# Patient Record
Sex: Female | Born: 1990 | Race: Black or African American | Hispanic: No | Marital: Single | Smoking: Never smoker
Health system: Southern US, Community
[De-identification: ages and names within clinical notes are randomized; demographics above are authoritative.]

## PROBLEM LIST (undated history)

## (undated) DIAGNOSIS — M549 Dorsalgia, unspecified: Secondary | ICD-10-CM

## (undated) HISTORY — DX: Dorsalgia, unspecified: M54.9

---

## 2020-12-08 ENCOUNTER — Ambulatory Visit: Payer: Self-pay | Attending: Family

## 2020-12-08 DIAGNOSIS — Z23 Encounter for immunization: Secondary | ICD-10-CM

## 2021-04-03 NOTE — Progress Notes (Signed)
   Covid-19 Vaccination Clinic  Name:  Robin [name]    MRN: [id number] DOB: [birth year]  04/03/2021  Ms. Robin [name] was observed post Covid-19 immunization for 15 minutes without incident. She was provided with Vaccine Information Sheet and instruction to access the V-Safe system.   Ms. Robin [name] was instructed to call 911 with any severe reactions post vaccine: Marland Kitchen Difficulty breathing  . Swelling of face and throat  . A fast heartbeat  . A bad rash all over body  . Dizziness and weakness   Immunizations Administered    Name Date Dose VIS Date Route   Pfizer COVID-19 Vaccine 12/08/2020 11:30 AM 0.3 mL 09/07/2020 Intramuscular   Manufacturer: [employer], [employer]   Lot: [id number]   NDC: [address]-1

## 2021-10-27 DIAGNOSIS — M25561 Pain in right knee: Secondary | ICD-10-CM | POA: Diagnosis not present

## 2022-02-13 DIAGNOSIS — M25561 Pain in right knee: Secondary | ICD-10-CM | POA: Diagnosis not present

## 2022-04-17 DIAGNOSIS — Z712 Person consulting for explanation of examination or test findings: Secondary | ICD-10-CM | POA: Diagnosis not present

## 2022-04-23 ENCOUNTER — Other Ambulatory Visit: Payer: Self-pay | Admitting: Obstetrics

## 2022-04-23 DIAGNOSIS — R102 Pelvic and perineal pain: Secondary | ICD-10-CM

## 2022-04-26 ENCOUNTER — Ambulatory Visit
Admission: RE | Admit: 2022-04-26 | Discharge: 2022-04-26 | Disposition: A | Discharge status: Home / Self Care | Payer: BC Managed Care – PPO | Source: Ambulatory Visit | Attending: Obstetrics | Admitting: Obstetrics

## 2022-04-26 DIAGNOSIS — D251 Intramural leiomyoma of uterus: Secondary | ICD-10-CM | POA: Diagnosis not present

## 2022-04-26 DIAGNOSIS — R102 Pelvic and perineal pain: Secondary | ICD-10-CM

## 2022-05-11 DIAGNOSIS — D259 Leiomyoma of uterus, unspecified: Secondary | ICD-10-CM | POA: Diagnosis not present

## 2022-05-11 DIAGNOSIS — E669 Obesity, unspecified: Secondary | ICD-10-CM | POA: Diagnosis not present

## 2022-05-11 DIAGNOSIS — N946 Dysmenorrhea, unspecified: Secondary | ICD-10-CM | POA: Diagnosis not present

## 2022-06-01 DIAGNOSIS — S79922A Unspecified injury of left thigh, initial encounter: Secondary | ICD-10-CM | POA: Diagnosis not present

## 2022-06-01 DIAGNOSIS — Q113 Macrophthalmos: Secondary | ICD-10-CM | POA: Diagnosis not present

## 2022-06-01 DIAGNOSIS — J392 Other diseases of pharynx: Secondary | ICD-10-CM | POA: Diagnosis not present

## 2022-07-12 DIAGNOSIS — F439 Reaction to severe stress, unspecified: Secondary | ICD-10-CM | POA: Diagnosis not present

## 2022-08-09 DIAGNOSIS — M542 Cervicalgia: Secondary | ICD-10-CM | POA: Diagnosis not present

## 2022-08-24 DIAGNOSIS — F439 Reaction to severe stress, unspecified: Secondary | ICD-10-CM | POA: Diagnosis not present

## 2022-08-28 DIAGNOSIS — M25561 Pain in right knee: Secondary | ICD-10-CM | POA: Diagnosis not present

## 2022-09-04 DIAGNOSIS — Z131 Encounter for screening for diabetes mellitus: Secondary | ICD-10-CM | POA: Diagnosis not present

## 2022-09-04 DIAGNOSIS — Z0000 Encounter for general adult medical examination without abnormal findings: Secondary | ICD-10-CM | POA: Diagnosis not present

## 2022-09-13 DIAGNOSIS — Z113 Encounter for screening for infections with a predominantly sexual mode of transmission: Secondary | ICD-10-CM | POA: Diagnosis not present

## 2022-09-27 DIAGNOSIS — H5203 Hypermetropia, bilateral: Secondary | ICD-10-CM | POA: Diagnosis not present

## 2022-09-27 DIAGNOSIS — H52223 Regular astigmatism, bilateral: Secondary | ICD-10-CM | POA: Diagnosis not present

## 2022-09-28 DIAGNOSIS — F439 Reaction to severe stress, unspecified: Secondary | ICD-10-CM | POA: Diagnosis not present

## 2022-11-21 DIAGNOSIS — Z23 Encounter for immunization: Secondary | ICD-10-CM | POA: Diagnosis not present

## 2023-01-11 DIAGNOSIS — F439 Reaction to severe stress, unspecified: Secondary | ICD-10-CM | POA: Diagnosis not present

## 2023-02-18 DIAGNOSIS — R051 Acute cough: Secondary | ICD-10-CM | POA: Diagnosis not present

## 2023-03-06 DIAGNOSIS — R051 Acute cough: Secondary | ICD-10-CM | POA: Diagnosis not present

## 2023-04-01 DIAGNOSIS — M5432 Sciatica, left side: Secondary | ICD-10-CM | POA: Diagnosis not present

## 2023-04-01 DIAGNOSIS — M545 Low back pain, unspecified: Secondary | ICD-10-CM | POA: Diagnosis not present

## 2023-04-03 DIAGNOSIS — F439 Reaction to severe stress, unspecified: Secondary | ICD-10-CM | POA: Diagnosis not present

## 2023-04-27 IMAGING — US US PELVIS COMPLETE WITH TRANSVAGINAL
1 series · 13 of 25 positions shown · non-contrast
Comparison: None

CLINICAL DATA: Pelvic pain for 2 months, LMP 04/16/2022



[Series 1: us pelvis complete with transvaginal · 0.26mm/px · 68 acquisitions, 13 frames shown]
[im 1/68]
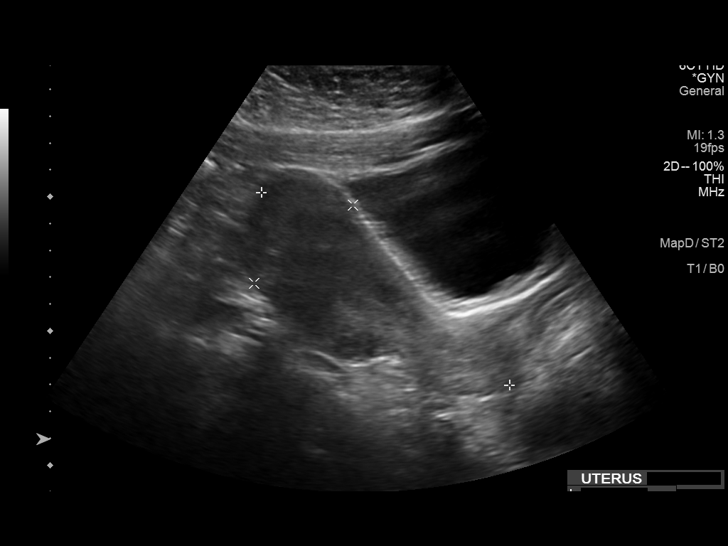
[im 6/68]
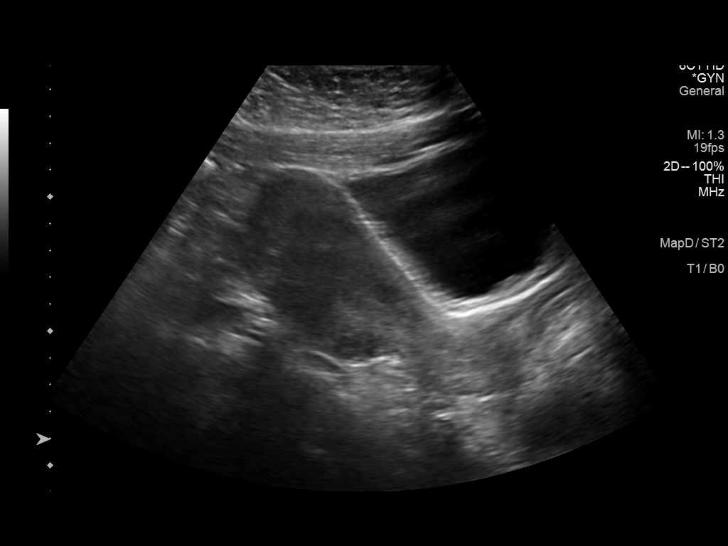
[im 12/68]
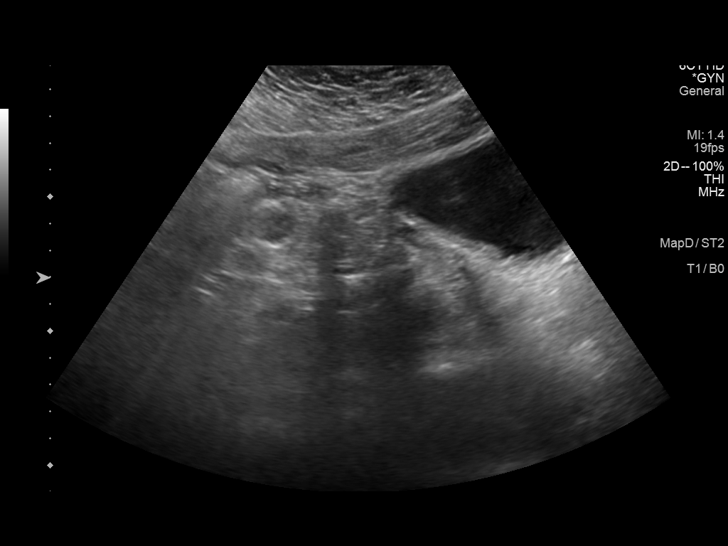
[im 17/68]
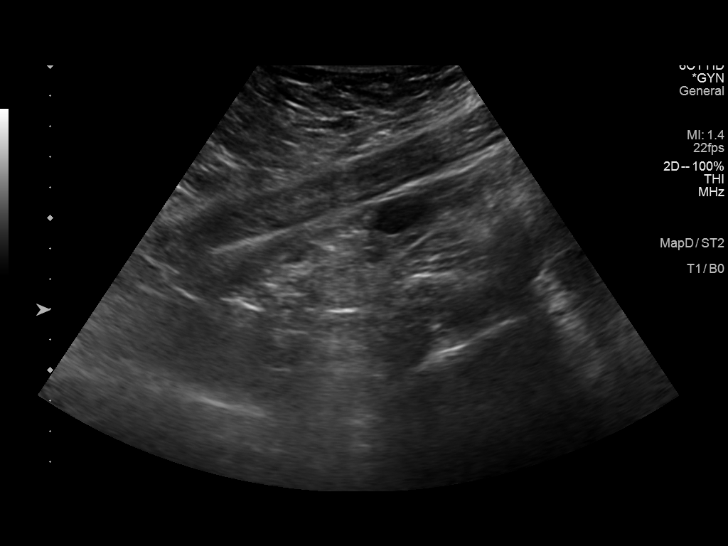
[im 23/68]
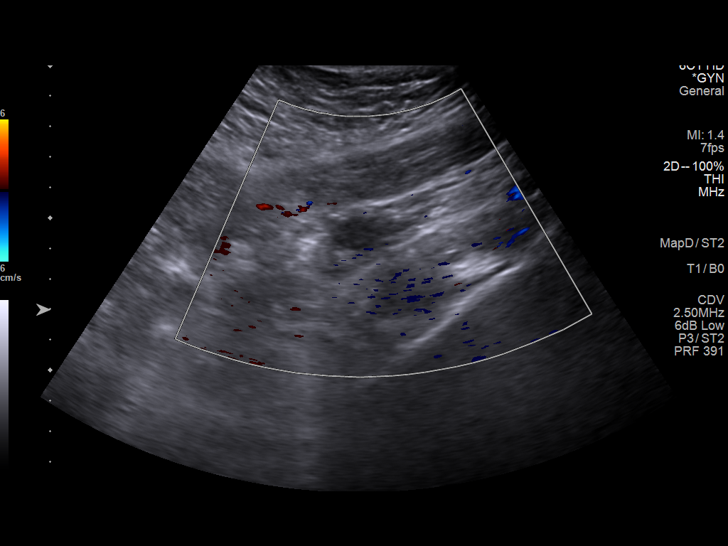
[im 28/68]
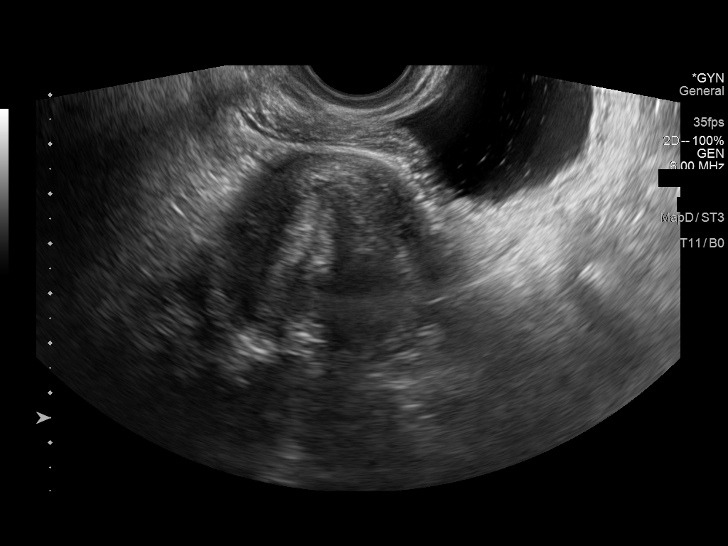
[im 34/68]
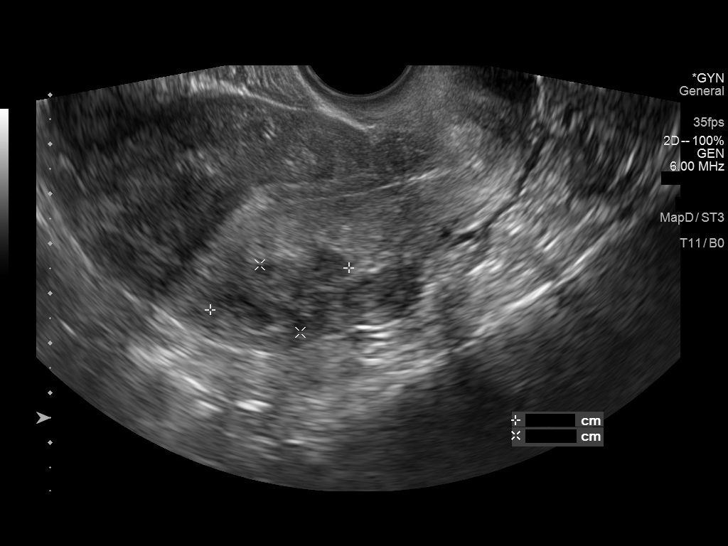
[im 40/68]
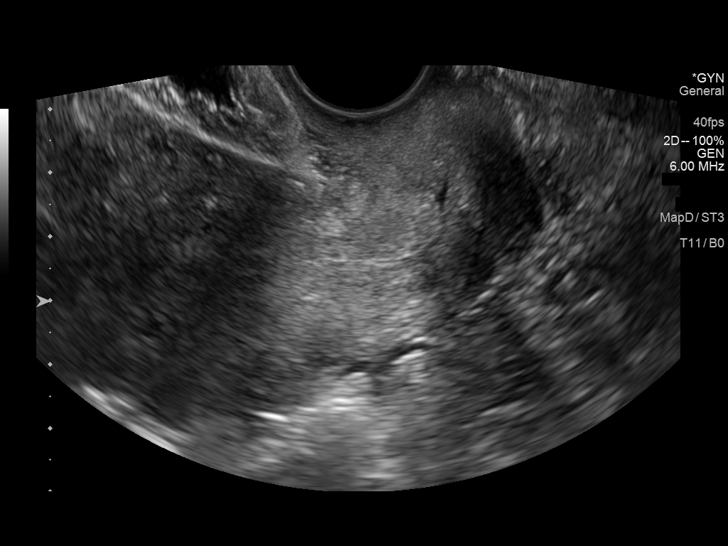
[im 45/68]
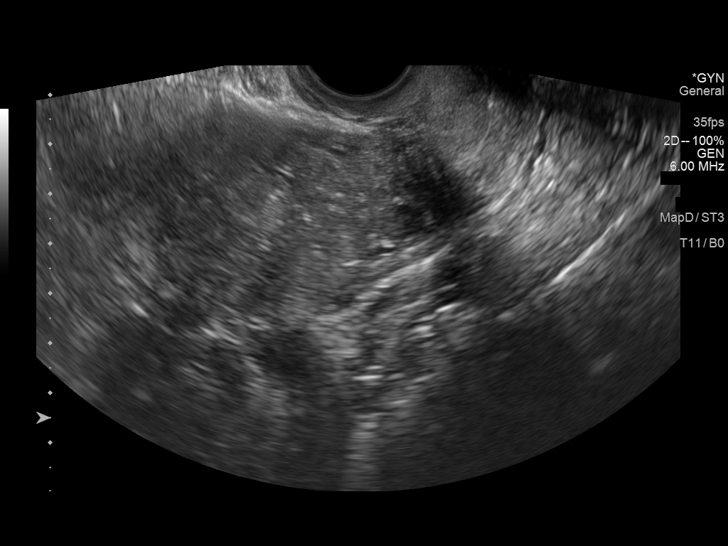
[im 51/68]
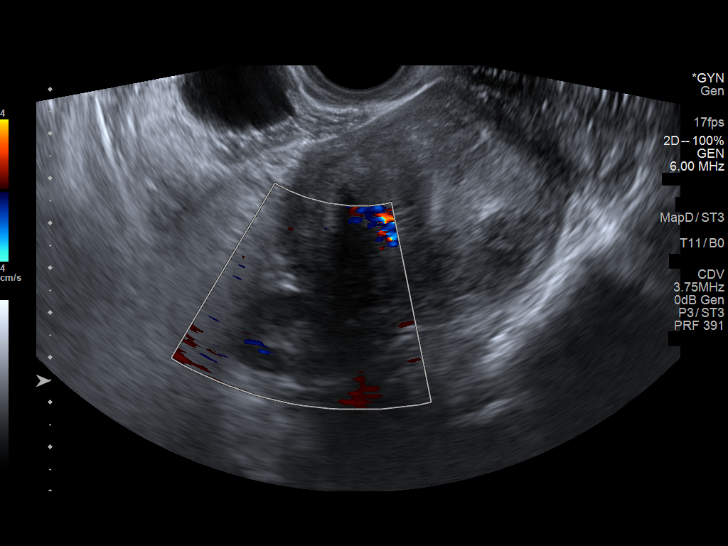
[im 56/68]
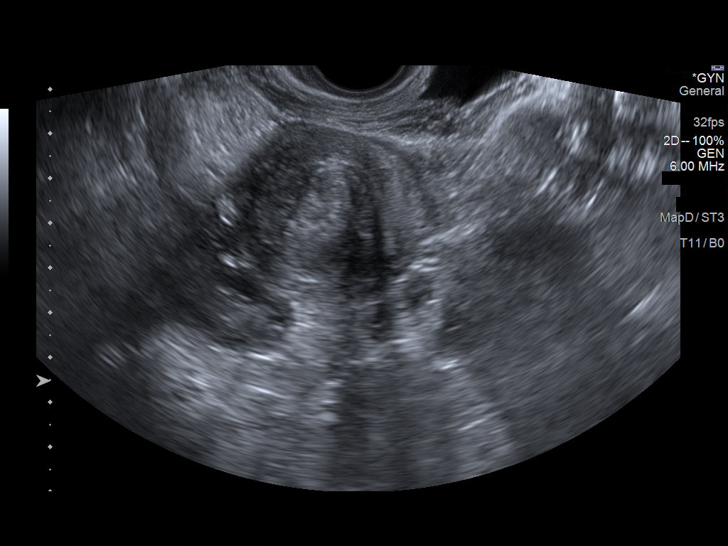
[im 62/68]
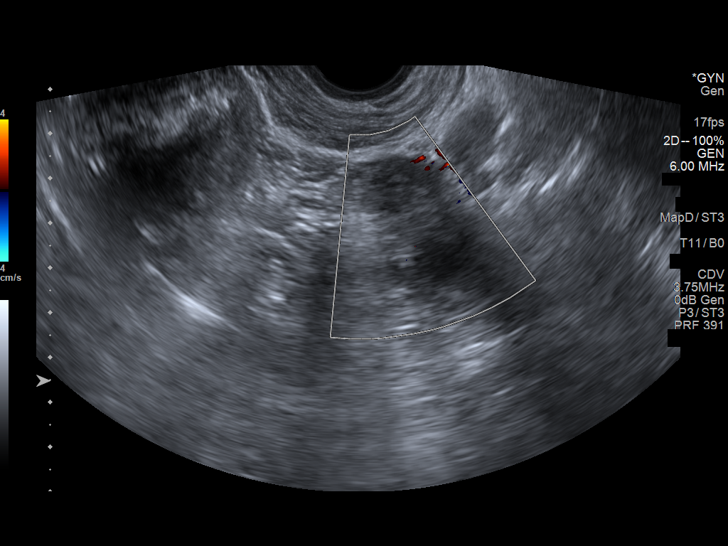
[im 68/68]
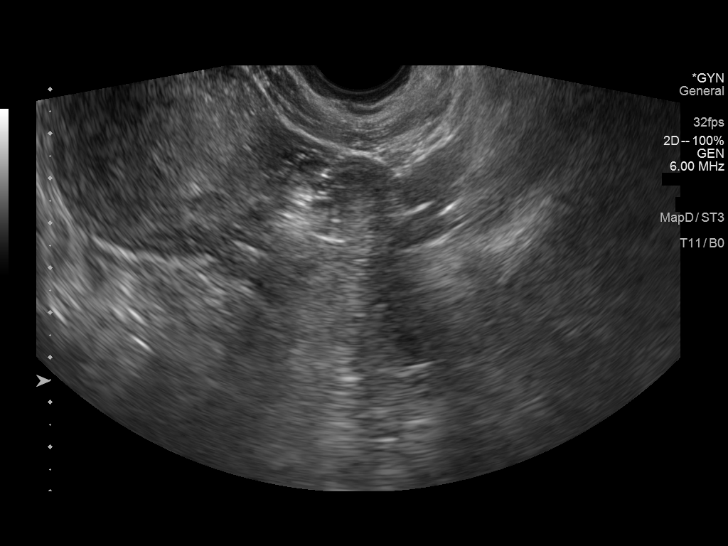

[13 of 25 positions shown; findings below may reference images not displayed]

FINDINGS: Uterus

Measurements: 11.7 x 4.7 x 5.0 cm = volume: 144 mL. Anteverted.
Heterogeneous myometrium. Several masses are seen within the
myometrium consistent with leiomyomata. These include a 3.8 cm
anterior wall submucosal leiomyoma at upper uterine segment,
exophytic posterior wall leiomyoma 1.7 cm diameter, and and
intramural 2.9 cm diameter posterior upper uterine leiomyoma.

Endometrium

Thickness: 10 mm.  No endometrial fluid or mass

Right ovary

Measurements: 3.5 x 2.0 x 2.2 cm = volume: 8.2 mL. Normal morphology
without mass

Left ovary

Measurements: 4.2 x 2.2 x 2.1 cm = volume: 10 mL. Normal morphology
without mass

Other findings

No free pelvic fluid or adnexal masses.
IMPRESSION: Three small uterine leiomyomata as above.

Remainder of exam unremarkable.

## 2023-06-21 DIAGNOSIS — S80861A Insect bite (nonvenomous), right lower leg, initial encounter: Secondary | ICD-10-CM | POA: Diagnosis not present

## 2023-06-21 DIAGNOSIS — S80862A Insect bite (nonvenomous), left lower leg, initial encounter: Secondary | ICD-10-CM | POA: Diagnosis not present

## 2023-07-03 DIAGNOSIS — F439 Reaction to severe stress, unspecified: Secondary | ICD-10-CM | POA: Diagnosis not present

## 2023-07-09 DIAGNOSIS — Z114 Encounter for screening for human immunodeficiency virus [HIV]: Secondary | ICD-10-CM | POA: Diagnosis not present

## 2023-07-09 DIAGNOSIS — R7303 Prediabetes: Secondary | ICD-10-CM | POA: Diagnosis not present

## 2023-07-09 DIAGNOSIS — Z131 Encounter for screening for diabetes mellitus: Secondary | ICD-10-CM | POA: Diagnosis not present

## 2023-07-09 DIAGNOSIS — Z0001 Encounter for general adult medical examination with abnormal findings: Secondary | ICD-10-CM | POA: Diagnosis not present

## 2023-07-09 DIAGNOSIS — Z113 Encounter for screening for infections with a predominantly sexual mode of transmission: Secondary | ICD-10-CM | POA: Diagnosis not present

## 2023-07-09 DIAGNOSIS — Z1329 Encounter for screening for other suspected endocrine disorder: Secondary | ICD-10-CM | POA: Diagnosis not present

## 2023-08-14 DIAGNOSIS — H5213 Myopia, bilateral: Secondary | ICD-10-CM | POA: Diagnosis not present

## 2023-08-14 DIAGNOSIS — H52223 Regular astigmatism, bilateral: Secondary | ICD-10-CM | POA: Diagnosis not present

## 2023-08-14 DIAGNOSIS — H5203 Hypermetropia, bilateral: Secondary | ICD-10-CM | POA: Diagnosis not present

## 2023-09-24 ENCOUNTER — Encounter: Payer: Self-pay | Admitting: Bariatrics

## 2023-09-24 ENCOUNTER — Ambulatory Visit: Payer: BC Managed Care – PPO | Admitting: Bariatrics

## 2023-09-24 VITALS — BP 132/88 | HR 97 | Temp 97.5°F | Ht 64.0 in | Wt 227.0 lb

## 2023-09-24 DIAGNOSIS — Z833 Family history of diabetes mellitus: Secondary | ICD-10-CM | POA: Diagnosis not present

## 2023-09-24 DIAGNOSIS — E66812 Obesity, class 2: Secondary | ICD-10-CM | POA: Diagnosis not present

## 2023-09-24 DIAGNOSIS — Z6838 Body mass index (BMI) 38.0-38.9, adult: Secondary | ICD-10-CM | POA: Diagnosis not present

## 2023-09-24 DIAGNOSIS — E6609 Other obesity due to excess calories: Secondary | ICD-10-CM

## 2023-09-24 DIAGNOSIS — R7309 Other abnormal glucose: Secondary | ICD-10-CM | POA: Diagnosis not present

## 2023-09-24 NOTE — Progress Notes (Signed)
Office: [phone]  /  Fax: [phone]   Initial Visit  Robin [name] was seen in clinic today to evaluate for obesity. She is interested in losing weight to improve overall health and reduce the risk of weight related complications. She presents today to review program treatment options, initial physical assessment, and evaluation.     She was [facility] by: [facility]  When asked what else they would like to accomplish? She states: Adopt healthier eating patterns, Improve quality of life, and Other: Be less sedentary   When asked how has your weight affected you? She states: Other: elevated glucose  Some associated conditions: Other: iron deficiency   Contributing factors: Family history  Weight promoting medications identified: None  Current nutrition plan: Low-carb and Other: On-line program, and some plant based, increase protein  Current level of physical activity: Walking and Step counting  Current or previous pharmacotherapy: None  Response to medication: Never tried medications   Past medical history includes:  History reviewed. No pertinent past medical history.   Objective:   BP 132/88   Pulse 97   Temp (!) 97.5 F (36.4 C)   Ht 5\' 4"  (1.626 m)   Wt 227 lb (103 kg)   SpO2 98%   BMI 38.96 kg/m  She was weighed on the bioimpedance scale: Body mass index is 38.96 kg/m.  Peak Weight:227 , Body Fat%:42.7%, Visceral Fat Rating:10, Weight trend over the last 12 months: has fluctuated  General:  Alert, oriented and cooperative. Patient is in no acute distress.  Respiratory: Normal respiratory effort, no problems with respiration noted  Extremities: Normal range of motion.    Mental Status: Normal mood and affect. Normal behavior. Normal judgment and thought content.   DIAGNOSTIC DATA REVIEWED:  BMET No results found for: "NA", "K", "CL", "CO2", "GLUCOSE", "BUN", "CREATININE", "CALCIUM", "GFRNONAA", "GFRAA" No results found for: "HGBA1C" No  results found for: "INSULIN" CBC No results found for: "WBC", "RBC", "HGB", "HCT", "PLT", "MCV", "MCH", "MCHC", "RDW" Iron/TIBC/Ferritin/ %Sat No results found for: "IRON", "TIBC", "FERRITIN", "IRONPCTSAT" Lipid Panel  No results found for: "CHOL", "TRIG", "HDL", "CHOLHDL", "VLDL", "LDLCALC", "LDLDIRECT" Hepatic Function Panel  No results found for: "PROT", "ALBUMIN", "AST", "ALT", "ALKPHOS", "BILITOT", "BILIDIR", "IBILI" No results found for: "TSH"   Assessment and Plan:    Elevated glucose:   She has had an elevated glucose reading recently  She has a family history of diabetes ( Paternal ).    Plan:  Will do labs today ( HgbA1c and fasting insulin ) at her first visit.  Will minimize all carbohydrates both sweets and starches.  Will incorporate more plant-based foods.   Family history of Diabetes type 2 (paternal)  She states that her glucose has always been normal until recently. She denies s/s of frank diabetes.  No medications that would promote an elevation in glucose.   Plan: Will do labs and begin the plan and exercise.     Class 2 obesity due to excess calories without serious comorbidity with body mass index (BMI) of 38.0 to 38.9 in adult    Generalized Obesity: Current BMI 38.9    Obesity Treatment / Action Plan:  Patient will work on garnering support from family and friends to begin weight loss journey. Will work on eliminating or reducing the presence of highly palatable, calorie dense foods in the home. Will complete provided nutritional and psychosocial assessment questionnaire before the next appointment. Will be scheduled for indirect calorimetry to determine resting energy expenditure in a  fasting state.  This will allow [place] to create a reduced calorie, high-protein meal plan to promote loss of fat mass while preserving muscle mass. Will avoid skipping meals which may result in increased hunger signals and overeating at certain times. Counseled on the  health benefits of losing 5%-15% of total body weight. Was counseled on nutritional approaches to weight loss and benefits of reducing processed foods and consuming plant-based foods and high quality protein as part of nutritional weight management. Was counseled on pharmacotherapy and role as an adjunct in weight management.   Obesity Education Performed Today:  She was weighed on the bioimpedance scale and results were discussed and documented in the synopsis.  We discussed obesity as a disease and the importance of a more detailed evaluation of all the factors contributing to the disease.  We discussed the importance of long term lifestyle changes which include nutrition, exercise and behavioral modifications as well as the importance of customizing this to her specific health and social needs.  We discussed the benefits of reaching a healthier weight to alleviate the symptoms of existing conditions and reduce the risks of the biomechanical, metabolic and psychological effects of obesity.  Discussed New Patient/Late Arrival, and Cancellation Policies. Patient voiced understanding and allowed to ask questions.   Robin [name] appears to be in the action stage of change and states they are ready to start intensive lifestyle modifications and behavioral modifications.  30 minutes was spent today on this visit including the above counseling, pre-visit chart review, and post-visit documentation.  Reviewed by clinician on day of visit: allergies, medications, problem list, medical history, surgical history, family history, social history, and previous encounter notes.    Dalan Cowger A. Lorretta HarpO.

## 2023-09-25 DIAGNOSIS — Z0289 Encounter for other administrative examinations: Secondary | ICD-10-CM

## 2023-10-07 DIAGNOSIS — R0982 Postnasal drip: Secondary | ICD-10-CM | POA: Diagnosis not present

## 2023-10-07 DIAGNOSIS — J309 Allergic rhinitis, unspecified: Secondary | ICD-10-CM | POA: Diagnosis not present

## 2023-10-07 DIAGNOSIS — G245 Blepharospasm: Secondary | ICD-10-CM | POA: Diagnosis not present

## 2023-11-25 ENCOUNTER — Ambulatory Visit: Payer: BC Managed Care – PPO | Admitting: Bariatrics

## 2023-12-03 ENCOUNTER — Encounter: Payer: Self-pay | Admitting: Bariatrics

## 2023-12-03 ENCOUNTER — Ambulatory Visit: Payer: BC Managed Care – PPO | Admitting: Bariatrics

## 2023-12-03 VITALS — BP 110/76 | HR 72 | Temp 97.6°F | Ht 64.0 in | Wt 229.0 lb

## 2023-12-03 DIAGNOSIS — R7309 Other abnormal glucose: Secondary | ICD-10-CM

## 2023-12-03 DIAGNOSIS — Z0000 Encounter for general adult medical examination without abnormal findings: Secondary | ICD-10-CM | POA: Diagnosis not present

## 2023-12-03 DIAGNOSIS — Z833 Family history of diabetes mellitus: Secondary | ICD-10-CM | POA: Diagnosis not present

## 2023-12-03 DIAGNOSIS — R0602 Shortness of breath: Secondary | ICD-10-CM

## 2023-12-03 DIAGNOSIS — E611 Iron deficiency: Secondary | ICD-10-CM

## 2023-12-03 DIAGNOSIS — R5383 Other fatigue: Secondary | ICD-10-CM

## 2023-12-03 DIAGNOSIS — Z1331 Encounter for screening for depression: Secondary | ICD-10-CM

## 2023-12-03 DIAGNOSIS — E66812 Obesity, class 2: Secondary | ICD-10-CM

## 2023-12-03 DIAGNOSIS — E739 Lactose intolerance, unspecified: Secondary | ICD-10-CM

## 2023-12-03 DIAGNOSIS — Z6839 Body mass index (BMI) 39.0-39.9, adult: Secondary | ICD-10-CM

## 2023-12-03 NOTE — Progress Notes (Signed)
 At a Glance:  Vitals Temp: 97.6 F (36.4 C) BP: 110/76 Pulse Rate: 72 SpO2: 99 %   Anthropometric Measurements Height: 5' 4 (1.626 m) Weight: 229 lb (103.9 kg) BMI (Calculated): 39.29 Starting Weight: 229lb Peak Weight: 233lb   Body Composition  Body Fat %: 42.7 % Fat Mass (lbs): 98 lbs Muscle Mass (lbs): 124.8 lbs Total Body Water (lbs): 86.6 lbs Visceral Fat Rating : 10   Other Clinical Data Fasting: yes Labs: yes Today's Visit #: 1    EKG: Normal sinus rhythm, rate 72.  Indirect Calorimeter:   Resting Metabolic Rate ( RMR):  RMR (actual): 1670 kcal RMR (calculated): 1857 kcal The calculated basal metabolic rate is 8142 kcal thus her basal metabolic rate is worse than expected.  Plan:   Indirect calorimeter completed, interpreted and reviewed with patient today and allowed to ask questions.  Discussed the implications for the chosen plan and exercise based on the RMR reading.  Will consider repeating the RMR in the future based on weight loss.    Chief Complaint:  Obesity   Subjective:  Robin [name] (MR# [id number]) is a 33 y.o. female who presents for evaluation and treatment of obesity and related comorbidities.   Robin [name] is currently in the action stage of change and ready to dedicate time achieving and maintaining a healthier weight. Robin [name] is interested in becoming our patient and working on intensive lifestyle modifications including (but not limited to) diet and exercise for weight loss.  Robin [name] has been struggling with her weight. She has been unsuccessful in either losing weight, maintaining weight loss, or reaching her healthy weight goal.  Robin [name]'s habits were reviewed today and are as follows: she has been heavy most of her life, she started gaining weight in 2022, she has significant food cravings issues, and she skips meals frequently.   She started gaining weight in 2022. Robin [name] She has dealt with weight issues for most of her life.   Current or  previous pharmacotherapy: None  Response to medication: Never tried medications  Other Fatigue Robin [name] admits to daytime somnolence and admits to waking up still tired. Patient has a history of symptoms of daytime fatigue. Robin [name] generally gets 5 or 6 hours of sleep per night, and states that she has difficulty falling back asleep if awakened. Snoring is not present. Apneic episodes is not present. Epworth Sleepiness Score is 4.   Shortness of Breath Robin [name] notes increasing shortness of breath with exercising and seems to be worsening over time with weight gain. She notes getting out of breath sooner with activity than she used to. This has not gotten worse recently. Robin [name] denies shortness of breath at rest or orthopnea.  Depression Screen Robin [name]'s Food and Mood (modified PHQ-9) score was 6. 5-9 mild depression      No data to display           Assessment and Plan:   Other Fatigue Robin [name] does feel that her weight is causing her energy to be lower than it should be. Fatigue may be related to obesity, depression or many other causes. Labs will be ordered, and in the meanwhile, Robin [name] will focus on self care including making healthy food choices, increasing physical activity and focusing on stress reduction.  Shortness of Breath Robin [name] does not feel that she gets out of breath more easily that she used to when she exercises. Robin [name]'s shortness of breath appears to be obesity related and exercise induced. She has agreed to work on weight loss  and gradually increase exercise to treat her exercise induced shortness of breath. Will continue to monitor closely.  Health Maintenance:   Obesity   Plan: Will do EKG, indirect calorimetry, and labs.     Vitamin D  Deficiency She is at risk for vitamin D  deficiency due to obesity.  She is not on   Plan: Will check for vitamin D  deficiency.   Robin [name] had a positive depression screening. Depression is commonly associated with obesity and often results in  emotional eating behaviors. We will monitor this closely and work on CBT to help improve the non-hunger eating patterns. Referral to Psychology may be required if no improvement is seen as she continues in our clinic.  Elevated glucose:    She has had an elevated glucose reading recently  She has a family history of diabetes ( Paternal ).      Plan:  Will do labs today ( HgbA1c and fasting insulin  )  Will minimize all carbohydrates both sweets and starches.  Will incorporate more plant-based foods.    Family history of Diabetes type 2 (paternal)   She states that her glucose has always been normal until recently. She denies s/s of frank diabetes.  No medications that would promote an elevation in glucose.    Plan: Will do labs and begin the plan and exercise.    Lactose intolerance:   She states that she is lactose intolerant, but can eat yogurt.   Plan: Will avoid triggers, and lactose rich foods.   Iron deficiency:   States that she has a history of iron deficiency.   Plan: Check ferritin and anemia panel.   Previous labs reviewed today. Date: none available.  Labs done today CMP, Lipids, Insulin , HgbA1c, Vit D, Anemia Panel, Ferritin, and TSH   Morbid Obesity: BMI (Calculated): 39.29   Robin [name] is currently in the action stage of change and her goal is to begin weight loss efforts. I recommend Robin [name] begin the structured treatment plan as follows:  She has agreed to Category 2 Plan  Exercise goals: For substantial health benefits, adults should do at least 150 minutes (2 hours and 30 minutes) a week of moderate-intensity, or 75 minutes (1 hour and 15 minutes) a week of vigorous-intensity aerobic physical activity, or an equivalent combination of moderate- and vigorous-intensity aerobic activity. Aerobic activity should be performed in episodes of at least 10 minutes, and preferably, it should be spread throughout the week.  Behavioral modification strategies:increasing lean  protein intake, decreasing simple carbohydrates, increasing vegetables, increase high fiber foods, meal planning and cooking strategies, keeping healthy foods in the home, better snacking choices, and planning for success  She was informed of the importance of frequent follow-up visits to maximize her success with intensive lifestyle modifications for her multiple health conditions. She was informed we would discuss her lab results at her next visit unless there is a critical issue that needs to be addressed sooner. Robin [name] agreed to keep her next visit at the agreed upon time to discuss these results.  Objective:  General: Cooperative, alert, well developed, in no acute distress. HEENT: Conjunctivae and lids unremarkable. Cardiovascular: Regular rhythm.  Lungs: Normal work of breathing. Neurologic: No focal deficits.   No results found for: CREATININE, BUN, NA, K, CL, CO2 No results found for: ALT, AST, GGT, ALKPHOS, BILITOT No results found for: HGBA1C No results found for: INSULIN  No results found for: TSH No results found for: CHOL, HDL, LDLCALC, LDLDIRECT, TRIG, CHOLHDL No results found for: WBC, HGB,  HCT, MCV, PLT No results found for: IRON, TIBC, FERRITIN  Attestation Statements:  Applicable history such as the following:  allergies, medications, problem list, medical history, surgical history, family history, social history, and previous encounter notes reviewed by clinician on day of visit:  Time spent on visit including the items listed below was 48 minutes.  -preparing to see the patient (e.g., review of tests, history, previous notes) -obtaining and/or reviewing separately obtained history -counseling and educating the patient/family/caregiver -documenting clinical information in the electronic or other health record -ordering medications, tests, or procedures -independently interpreting results and communicating results  to the patient/ family/caregiver -referring and communicating with other health care professionals  -care coordination   This may have been prepared with the assistance of Engineer, Civil (consulting).  Occasional wrong-word or sound-a-like substitutions may have occurred due to the inherent limitations of voice recognition software.    Clayborne Daring, DO

## 2023-12-05 LAB — COMPREHENSIVE METABOLIC PANEL
ALT: 21 [IU]/L (ref 0–32)
AST: 20 [IU]/L (ref 0–40)
Albumin: 4.3 g/dL (ref 3.9–4.9)
Alkaline Phosphatase: 83 [IU]/L (ref 44–121)
BUN/Creatinine Ratio: 17 (ref 9–23)
BUN: 13 mg/dL (ref 6–20)
Bilirubin Total: 0.5 mg/dL (ref 0.0–1.2)
CO2: 25 mmol/L (ref 20–29)
Calcium: 9.8 mg/dL (ref 8.7–10.2)
Chloride: 101 mmol/L (ref 96–106)
Creatinine, Ser: 0.76 mg/dL (ref 0.57–1.00)
Globulin, Total: 2.8 g/dL (ref 1.5–4.5)
Glucose: 84 mg/dL (ref 70–99)
Potassium: 4.8 mmol/L (ref 3.5–5.2)
Sodium: 138 mmol/L (ref 134–144)
Total Protein: 7.1 g/dL (ref 6.0–8.5)
eGFR: 107 mL/min/{1.73_m2} (ref >59)

## 2023-12-05 LAB — ANEMIA PANEL
Ferritin: 23 ng/mL (ref 15–150)
Folate, Hemolysate: 340 ng/mL
Folate, RBC: 852 ng/mL (ref >498)
Hematocrit: 39.9 % (ref 34.0–46.6)
Iron Saturation: 15 % (ref 15–55)
Iron: 60 ug/dL (ref 27–159)
Retic Ct Pct: 1.1 % (ref 0.6–2.6)
Total Iron Binding Capacity: 388 ug/dL (ref 250–450)
UIBC: 328 ug/dL (ref 131–425)
Vitamin B-12: 1042 pg/mL (ref 232–1245)

## 2023-12-05 LAB — INSULIN, RANDOM: INSULIN: 10.4 u[IU]/mL (ref 2.6–24.9)

## 2023-12-05 LAB — HEMOGLOBIN A1C
Est. average glucose Bld gHb Est-mCnc: 114 mg/dL
Hgb A1c MFr Bld: 5.6 % (ref 4.8–5.6)

## 2023-12-05 LAB — LIPID PANEL WITH LDL/HDL RATIO
Cholesterol, Total: 166 mg/dL (ref 100–199)
HDL: 50 mg/dL (ref >39)
LDL Chol Calc (NIH): 104 mg/dL — ABNORMAL HIGH (ref 0–99)
LDL/HDL Ratio: 2.1 {ratio} (ref 0.0–3.2)
Triglycerides: 60 mg/dL (ref 0–149)
VLDL Cholesterol Cal: 12 mg/dL (ref 5–40)

## 2023-12-05 LAB — VITAMIN D 25 HYDROXY (VIT D DEFICIENCY, FRACTURES): Vit D, 25-Hydroxy: 27.4 ng/mL — ABNORMAL LOW (ref 30.0–100.0)

## 2023-12-05 LAB — TSH: TSH: 2.35 u[IU]/mL (ref 0.450–4.500)

## 2023-12-09 ENCOUNTER — Ambulatory Visit: Payer: BC Managed Care – PPO | Admitting: Bariatrics

## 2023-12-17 ENCOUNTER — Ambulatory Visit: Payer: BC Managed Care – PPO | Admitting: Bariatrics

## 2023-12-17 ENCOUNTER — Encounter: Payer: Self-pay | Admitting: Bariatrics

## 2023-12-17 VITALS — BP 115/75 | HR 80 | Temp 98.5°F | Ht 64.0 in | Wt 230.0 lb

## 2023-12-17 DIAGNOSIS — Z6839 Body mass index (BMI) 39.0-39.9, adult: Secondary | ICD-10-CM

## 2023-12-17 DIAGNOSIS — E669 Obesity, unspecified: Secondary | ICD-10-CM | POA: Diagnosis not present

## 2023-12-17 DIAGNOSIS — E559 Vitamin D deficiency, unspecified: Secondary | ICD-10-CM

## 2023-12-17 DIAGNOSIS — E739 Lactose intolerance, unspecified: Secondary | ICD-10-CM | POA: Diagnosis not present

## 2023-12-17 DIAGNOSIS — E66812 Obesity, class 2: Secondary | ICD-10-CM

## 2023-12-17 MED ORDER — VITAMIN D (ERGOCALCIFEROL) 1.25 MG (50000 UNIT) PO CAPS: 50000.0000 [IU] | ORAL_CAPSULE | ORAL | 0 refills | Status: DC

## 2023-12-17 NOTE — Progress Notes (Addendum)
First follow-up after initial visit.        WEIGHT SUMMARY AND BIOMETRICS  Weight Lost Since Last Visit: 0  Weight Gained Since Last Visit: 1lb   Vitals Temp: 98.5 F (36.9 C) BP: 115/75 Pulse Rate: 80 SpO2: 100 %   Anthropometric Measurements Height: 5\' 4"  (1.626 m) Weight: 230 lb (104.3 kg) BMI (Calculated): 39.46 Weight at Last Visit: 229lb Weight Lost Since Last Visit: 0 Weight Gained Since Last Visit: 1lb Starting Weight: 229lb Total Weight Loss (lbs): 0 lb (0 kg)   Body Composition  Body Fat %: 43.9 % Fat Mass (lbs): 101 lbs Muscle Mass (lbs): 122.4 lbs Total Body Water (lbs): 87 lbs Visceral Fat Rating : 10   Other Clinical Data Fasting: yes Labs: no Today's Visit #: 2 Starting Date: 12/03/23    OBESITY Robin [name] is here to discuss her progress with her obesity treatment plan along with follow-up of her obesity related diagnoses.    Nutrition Plan: the Category 2 plan - 50% adherence.  Current exercise: walking  Interim History:  She is up 1 lb since her first  visit.  Not eating all of the food on the plan., Is not exceeding snack calorie allotment, Is skipping meals, Meeting protein goals., and Water intake is adequate.  Initial positives regarding the dietary plan: She is exercising more and getting more protein.  Initial challenges regarding  the dietary plan: She did not have her total amount of calories and protein down and would like to start using my fitness pal.   Pharmacotherapy: Hunger is moderately controlled.  Cravings are moderately controlled.  Assessment/Plan:   Vitamin D Insuffiency:  Vitamin D is not at goal of 50.  Most recent vitamin D level was 27.4. She is on  prescription ergocalciferol 50,000 IU weekly. Lab Results  Component Value Date   VD25OH 27.4 (L) 12/03/2023    Plan: Begin prescription vitamin  D 50,000 IU weekly.   Dietary lactose intolerance:   She has a history of dietary lactose intolerance.   Plan: Will make good decisions and avoid foods that cause her any GI issues.     Generalized Obesity: Current BMI BMI (Calculated): 39.46   Pharmacotherapy Plan  Discussed briefly medications for polyphasia but patient does not want to begin any medications at this time.  She is going to increase her water, fiber, and protein levels at this time.   Starlynn is currently in the action stage of change. As such, her goal is to continue with weight loss efforts.  She has agreed to the Category 2 plan and keeping a food journal with goal of 1,200 calories and 80 to 90 grams of protein daily.  Exercise goals: For substantial health benefits, adults should do at least 150 minutes (2 hours and 30 minutes) a week of moderate-intensity, or 75 minutes (1 hour and 15 minutes) a week of vigorous-intensity aerobic physical activity, or an equivalent combination of moderate- and vigorous-intensity aerobic activity. Aerobic activity should be performed in episodes of at least 10 minutes, and preferably, it should be spread throughout the week.  Behavioral modification strategies: increasing lean protein intake, no meal skipping, decrease eating out, meal planning , increase water intake, better snacking choices, planning for success, increasing fiber rich foods, get rid of junk food in the home, avoiding temptations, and keep healthy foods in the home.  She will start using my fitness pal and will follow the plan for 1,200 cal with 80 to 90 g of protein daily  Jonasia has agreed to follow-up with our clinic in 2 weeks.    Labs reviewed today from last visit (CMP, Lipids,  insulin, vitamin D, B 12, HCT, reticulocyte count, and TSH).   Objective:   VITALS: Per patient if applicable, see vitals. GENERAL: Alert and in no acute distress. CARDIOPULMONARY: No increased WOB. Speaking in clear sentences.  PSYCH:  Pleasant and cooperative. Speech normal rate and rhythm. Affect is appropriate. Insight and judgement are appropriate. Attention is focused, linear, and appropriate.  NEURO: Oriented as arrived to appointment on time with no prompting.   Attestation Statements:   This was prepared with the assistance of Engineer, civil (consulting).  Occasional wrong-word or sound-a-like substitutions may have occurred due to the inherent limitations of voice recognition software.   Corinna Capra, DO

## 2023-12-20 DIAGNOSIS — M5442 Lumbago with sciatica, left side: Secondary | ICD-10-CM | POA: Diagnosis not present

## 2023-12-30 DIAGNOSIS — M5416 Radiculopathy, lumbar region: Secondary | ICD-10-CM | POA: Diagnosis not present

## 2024-01-03 DIAGNOSIS — F439 Reaction to severe stress, unspecified: Secondary | ICD-10-CM | POA: Diagnosis not present

## 2024-01-06 ENCOUNTER — Ambulatory Visit: Payer: BC Managed Care – PPO | Admitting: Family Medicine

## 2024-01-13 ENCOUNTER — Ambulatory Visit: Payer: BC Managed Care – PPO | Admitting: Bariatrics

## 2024-01-13 ENCOUNTER — Encounter: Payer: Self-pay | Admitting: Bariatrics

## 2024-01-13 VITALS — BP 135/81 | HR 69 | Temp 98.9°F | Ht 64.0 in | Wt 229.0 lb

## 2024-01-13 DIAGNOSIS — E669 Obesity, unspecified: Secondary | ICD-10-CM

## 2024-01-13 DIAGNOSIS — F5089 Other specified eating disorder: Secondary | ICD-10-CM

## 2024-01-13 DIAGNOSIS — Z6839 Body mass index (BMI) 39.0-39.9, adult: Secondary | ICD-10-CM

## 2024-01-13 DIAGNOSIS — E559 Vitamin D deficiency, unspecified: Secondary | ICD-10-CM

## 2024-01-13 NOTE — Progress Notes (Signed)
 WEIGHT SUMMARY AND BIOMETRICS  Weight Lost Since Last Visit: 1lb  Weight Gained Since Last Visit: 0lb   Vitals Temp: 98.9 F (37.2 C) BP: 135/81 Pulse Rate: 69 SpO2: 99 %   Anthropometric Measurements Height: 5\' 4"  (1.626 m) Weight: 229 lb (103.9 kg) BMI (Calculated): 39.29 Weight at Last Visit: 230lb Weight Lost Since Last Visit: 1lb Weight Gained Since Last Visit: 0lb Starting Weight: 229lb Total Weight Loss (lbs): 0 lb (0 kg)   Body Composition  Body Fat %: 43.3 % Fat Mass (lbs): 99.4 lbs Muscle Mass (lbs): 123.6 lbs Total Body Water (lbs): 85.8 lbs Visceral Fat Rating : 10   Other Clinical Data Fasting: Yes Labs: No Today's Visit #: 3 Starting Date: 12/03/23    OBESITY Robin [name] is here to discuss her progress with her obesity treatment plan along with follow-up of her obesity related diagnoses.    Nutrition Plan: the Category 2 plan - 30-40% adherence.  Current exercise: walking  Interim History:  She is down 1 lb since her last visit.  She states that she has had some stress eating between 10 AM and 2 PM but feels like she can manage it at this time.  Eating all of the food on the plan., Is not skipping meals, Not journaling consistently., Meeting protein goals., and Reports excessive cravings.    Hunger is moderately controlled.  Cravings are moderately controlled.  Assessment/Plan:   Vitamin D Deficiency Vitamin D is not at goal of 50.  Most recent vitamin D level was 27.4. She is on  prescription ergocalciferol 50,000 IU weekly. Lab Results  Component Value Date   VD25OH 27.4 (L) 12/03/2023    Plan: Continue prescription vitamin D 50,000 IU weekly.   Eating disorder/emotional eating Cheynne has had issues with stress eating. Currently this is moderately controlled. Overall mood is stable. Denies suicidal/homicidal  ideation. Medication(s): none  Plan:  Specifically regarding patient's less desirable eating habits and patterns, we employed the technique of small changes when she cannot fully commit to her prudent nutritional plan. Discussed distractions to curb eating behaviors. Discussed activities to do with one's hands in the evening  Be sure to get adequate rest as lack of rest can trigger appetite.  Have plan in place for stressful events.  Consider other rewards besides food.   We discussed medications briefly but patient wants to wait to see if she can manage this on her own before starting a medication.    Generalized Obesity: Current BMI BMI (Calculated): 39.29    Robin [name] is currently in the action stage of change. As such, her goal is to continue with weight loss efforts.  She has agreed to the Category 2 plan.  Exercise goals: For substantial health benefits, adults should do at least 150 minutes (2 hours and 30 minutes) a week of moderate-intensity, or 75 minutes (1 hour and 15  minutes) a week of vigorous-intensity aerobic physical activity, or an equivalent combination of moderate- and vigorous-intensity aerobic activity. Aerobic activity should be performed in episodes of at least 10 minutes, and preferably, it should be spread throughout the week.  Behavioral modification strategies: increasing lean protein intake, decreasing simple carbohydrates , no meal skipping, meal planning , increase water intake, and better snacking choices.  [place] has agreed to follow-up with our clinic in 2 weeks.      Objective:   VITALS: Per patient if applicable, see vitals. GENERAL: Alert and in no acute distress. CARDIOPULMONARY: No increased WOB. Speaking in clear sentences.  PSYCH: Pleasant and cooperative. Speech normal rate and rhythm. Affect is appropriate. Insight and judgement are appropriate. Attention is focused, linear, and appropriate.  NEURO: Oriented as arrived to appointment on time with no  prompting.   Attestation Statements:   This was prepared with the assistance of Engineer, civil (consulting).  Occasional wrong-word or sound-a-like substitutions may have occurred due to the inherent limitations of voice recognition   Corinna Capra, DO

## 2024-01-20 DIAGNOSIS — M5416 Radiculopathy, lumbar region: Secondary | ICD-10-CM | POA: Diagnosis not present

## 2024-02-03 ENCOUNTER — Ambulatory Visit: Payer: BC Managed Care – PPO | Admitting: Bariatrics

## 2024-02-03 ENCOUNTER — Encounter: Payer: Self-pay | Admitting: Bariatrics

## 2024-02-03 VITALS — BP 126/85 | HR 71 | Temp 97.9°F | Ht 64.0 in | Wt 230.0 lb

## 2024-02-03 DIAGNOSIS — F5089 Other specified eating disorder: Secondary | ICD-10-CM

## 2024-02-03 DIAGNOSIS — Z6839 Body mass index (BMI) 39.0-39.9, adult: Secondary | ICD-10-CM | POA: Diagnosis not present

## 2024-02-03 DIAGNOSIS — E669 Obesity, unspecified: Secondary | ICD-10-CM | POA: Diagnosis not present

## 2024-02-03 DIAGNOSIS — E559 Vitamin D deficiency, unspecified: Secondary | ICD-10-CM

## 2024-02-03 DIAGNOSIS — E66812 Obesity, class 2: Secondary | ICD-10-CM

## 2024-02-03 MED ORDER — VITAMIN D (ERGOCALCIFEROL) 1.25 MG (50000 UNIT) PO CAPS: 50000.0000 [IU] | ORAL_CAPSULE | ORAL | 0 refills | Status: DC

## 2024-02-03 NOTE — Progress Notes (Signed)
 WEIGHT SUMMARY AND BIOMETRICS  Weight Lost Since Last Visit: 0lb  Weight Gained Since Last Visit: 0lb   Vitals Temp: 97.9 F (36.6 C) BP: 126/85 Pulse Rate: 71 SpO2: 100 %   Anthropometric Measurements Height: 5\' 4"  (1.626 m) Weight: 230 lb (104.3 kg) BMI (Calculated): 39.46 Weight at Last Visit: 229lb Weight Lost Since Last Visit: 0lb Weight Gained Since Last Visit: 0lb Starting Weight: 229lb Total Weight Loss (lbs): 0 lb (0 kg)   Body Composition  Body Fat %: 44.2 % Fat Mass (lbs): 101.8 lbs Muscle Mass (lbs): 122.2 lbs Total Body Water (lbs): 89 lbs Visceral Fat Rating : 11   Other Clinical Data Fasting: No Labs: No Today's Visit #: 4 Starting Date: 12/03/23    OBESITY Guillermo is here to discuss her progress with her obesity treatment plan along with follow-up of her obesity related diagnoses.    Nutrition Plan: the Category 2 plan - 60% adherence.  Current exercise: walking and weightlifting  Interim History:  Her weight remains the same. She is eating at home more. She is increasing her protein for each meal. She is eating breakfast on a regular basis.  Eating all of the food on the plan., Protein intake is as prescribed, Not meeting calorie goals., and Water intake is adequate.   Hunger is poorly controlled in the am only.  Cravings are well controlled.  Assessment/Plan:   Vitamin D Insuffiencey: Vitamin D is not at goal of 50.  Most recent vitamin D level was 27.4. She is on  prescription ergocalciferol 50,000 IU weekly. Lab Results  Component Value Date   VD25OH 27.4 (L) 12/03/2023    Plan: Refill prescription vitamin D 50,000 IU weekly.   Eating disorder/emotional eating: She will keep her water, protein, and fiber high.  Tyreonna has had issues with stress eating Her cravings are better at this time.  Currently this is  moderately controlled. Overall mood is stable. Denies suicidal/homicidal ideation. Medication(s): none  Plan:  Motivational interviewing as well as evidence-based interventions for health behavior change were utilized today including the discussion of self monitoring techniques, problem-solving barriers and SMART goal setting techniques.  Discussed distractions to curb eating behaviors. Discussed activities to do with one's hands in the evening  Be sure to get adequate rest as lack of rest can trigger appetite.  Have plan in place for stressful events.  Consider other rewards besides food.     Generalized Obesity: Current BMI BMI (Calculated): 39.46    Robin [name] is currently in the action stage of change. As such, her goal is to continue with weight loss efforts.  She has agreed to keeping a food journal with goal of 1,200 calories and 80 to 90 grams of protein daily.  Exercise goals: All adults should avoid inactivity. Some physical activity is better than none, and adults who participate in any amount of  physical activity gain some health benefits. She is going to the gym on a regular basis.   Behavioral modification strategies: increasing lean protein intake, decreasing simple carbohydrates , no meal skipping, meal planning , increase water intake, planning for success, increasing vegetables, avoiding temptations, keep healthy foods in the home, keep a strict food journal, mindful eating, and eat on smaller plate.  Nicolas has agreed to follow-up with our clinic in 2 weeks.     Objective:   VITALS: Per patient if applicable, see vitals. GENERAL: Alert and in no acute distress. CARDIOPULMONARY: No increased WOB. Speaking in clear sentences.  PSYCH: Pleasant and cooperative. Speech normal rate and rhythm. Affect is appropriate. Insight and judgement are appropriate. Attention is focused, linear, and appropriate.  NEURO: Oriented as arrived to appointment on time with no prompting.    Attestation Statements:   This was prepared with the assistance of Engineer, civil (consulting).  Occasional wrong-word or sound-a-like substitutions may have occurred due to the inherent limitations of voice recognition   Corinna Capra, DO

## 2024-02-05 DIAGNOSIS — N760 Acute vaginitis: Secondary | ICD-10-CM | POA: Diagnosis not present

## 2024-02-11 DIAGNOSIS — M5416 Radiculopathy, lumbar region: Secondary | ICD-10-CM | POA: Diagnosis not present

## 2024-02-24 ENCOUNTER — Ambulatory Visit: Admitting: Bariatrics

## 2024-03-13 DIAGNOSIS — R7303 Prediabetes: Secondary | ICD-10-CM | POA: Diagnosis not present

## 2024-03-13 DIAGNOSIS — L680 Hirsutism: Secondary | ICD-10-CM | POA: Diagnosis not present

## 2024-03-19 ENCOUNTER — Encounter: Payer: Self-pay | Admitting: Bariatrics

## 2024-03-19 ENCOUNTER — Ambulatory Visit: Admitting: Bariatrics

## 2024-03-19 VITALS — BP 110/70 | HR 67 | Temp 98.9°F | Ht 64.0 in | Wt 226.0 lb

## 2024-03-19 DIAGNOSIS — Z6838 Body mass index (BMI) 38.0-38.9, adult: Secondary | ICD-10-CM

## 2024-03-19 DIAGNOSIS — F5089 Other specified eating disorder: Secondary | ICD-10-CM | POA: Diagnosis not present

## 2024-03-19 DIAGNOSIS — E6609 Other obesity due to excess calories: Secondary | ICD-10-CM

## 2024-03-19 DIAGNOSIS — E559 Vitamin D deficiency, unspecified: Secondary | ICD-10-CM | POA: Diagnosis not present

## 2024-03-19 DIAGNOSIS — E669 Obesity, unspecified: Secondary | ICD-10-CM

## 2024-03-19 MED ORDER — VITAMIN D (ERGOCALCIFEROL) 1.25 MG (50000 UNIT) PO CAPS: 50000.0000 [IU] | ORAL_CAPSULE | ORAL | 0 refills | Status: DC

## 2024-03-19 NOTE — Progress Notes (Signed)
 WEIGHT SUMMARY AND BIOMETRICS  Weight Lost Since Last Visit: 4lb  Weight Gained Since Last Visit: 0   Vitals Temp: 98.9 F (37.2 C) BP: 110/70 Pulse Rate: 67 SpO2: 100 %   Anthropometric Measurements Height: 5\' 4"  (1.626 m) Weight: 226 lb (102.5 kg) BMI (Calculated): 38.77 Weight at Last Visit: 230lb Weight Lost Since Last Visit: 4lb Weight Gained Since Last Visit: 0 Starting Weight: 229lb Total Weight Loss (lbs): 3 lb (1.361 kg)   Body Composition  Body Fat %: 42.5 % Fat Mass (lbs): 96.2 lbs Muscle Mass (lbs): 123.6 lbs Total Body Water (lbs): 90 lbs Visceral Fat Rating : 10   Other Clinical Data Fasting: no Labs: no Today's Visit #: 5 Starting Date: 12/03/23    OBESITY Ebonique is here to discuss her progress with her obesity treatment plan along with follow-up of her obesity related diagnoses.    Nutrition Plan: the Category 2 plan - 50% adherence.  Current exercise:  Goes to the gym for exercise.  Interim History:  She is down 4 lbs since her last visit.  Eating all of the food on the plan., Protein intake is as prescribed, Is not skipping meals, and Water intake is inadequate.  Hunger is moderately controlled.  Cravings are moderately controlled.  Assessment/Plan:   Vitamin D  Deficiency Vitamin D  is not at goal of 50.  Most recent vitamin D  level was 27.4. She is on  prescription ergocalciferol  50,000 IU weekly. Lab Results  Component Value Date   VD25OH 27.4 (L) 12/03/2023    Plan: Refill prescription vitamin D  50,000 IU weekly.   Eating disorder/emotional eating Truda has had issues with stress eating and emotional eating. Currently this is moderately controlled. Overall mood is stable. Denies suicidal/homicidal ideation. Medication(s): none  Plan:  Motivational interviewing as well as evidence-based interventions for health  behavior change were utilized today including the discussion of self monitoring techniques, problem-solving barriers and SMART goal setting techniques.  Discussed distractions to curb eating behaviors. Discussed activities to do with one's hands in the evening  Be sure to get adequate rest as lack of rest can trigger appetite.  Have plan in place for stressful events.  Discussed low glycemic index foods. Discussed fruits sheet. Discussed not on starchy vegetables and information sheet given. Discussed fiber with a goal of 30 to 32 g/day but will start very slow and will gradually increase over time.     Generalized Obesity: Current BMI BMI (Calculated): 38.77  Khole is currently in the action stage of change. As such, her goal is to continue with weight loss efforts.  She has agreed to the Category 2 plan.  Exercise goals: All adults should avoid inactivity. Some physical activity is better than none, and adults who participate in any amount of physical activity gain some health benefits.  Behavioral modification strategies: increasing lean protein intake, no meal  skipping, meal planning , better snacking choices, planning for success, increasing fiber rich foods, ways to avoid boredom eating, and avoiding temptations.  Kerri has agreed to follow-up with our clinic in 3 weeks.     Objective:   VITALS: Per patient if applicable, see vitals. GENERAL: Alert and in no acute distress. CARDIOPULMONARY: No increased WOB. Speaking in clear sentences.  PSYCH: Pleasant and cooperative. Speech normal rate and rhythm. Affect is appropriate. Insight and judgement are appropriate. Attention is focused, linear, and appropriate.  NEURO: Oriented as arrived to appointment on time with no prompting.   Attestation Statements:     This was prepared with the assistance of Engineer, civil (consulting).  Occasional wrong-word or sound-a-like substitutions may have occurred due to the inherent limitations of  voice recognition   Kirk Peper, DO

## 2024-04-03 DIAGNOSIS — N946 Dysmenorrhea, unspecified: Secondary | ICD-10-CM | POA: Diagnosis not present

## 2024-05-14 ENCOUNTER — Ambulatory Visit: Admitting: Bariatrics

## 2024-05-18 DIAGNOSIS — F439 Reaction to severe stress, unspecified: Secondary | ICD-10-CM | POA: Diagnosis not present

## 2024-06-05 DIAGNOSIS — L309 Dermatitis, unspecified: Secondary | ICD-10-CM | POA: Diagnosis not present

## 2024-06-12 DIAGNOSIS — F439 Reaction to severe stress, unspecified: Secondary | ICD-10-CM | POA: Diagnosis not present

## 2024-06-16 ENCOUNTER — Ambulatory Visit: Admitting: Bariatrics

## 2024-06-16 ENCOUNTER — Encounter: Payer: Self-pay | Admitting: Bariatrics

## 2024-06-16 VITALS — BP 129/82 | HR 78 | Temp 98.3°F | Ht 64.0 in | Wt 227.0 lb

## 2024-06-16 DIAGNOSIS — E669 Obesity, unspecified: Secondary | ICD-10-CM | POA: Diagnosis not present

## 2024-06-16 DIAGNOSIS — Z6838 Body mass index (BMI) 38.0-38.9, adult: Secondary | ICD-10-CM | POA: Diagnosis not present

## 2024-06-16 DIAGNOSIS — E559 Vitamin D deficiency, unspecified: Secondary | ICD-10-CM

## 2024-06-16 DIAGNOSIS — R632 Polyphagia: Secondary | ICD-10-CM

## 2024-06-16 DIAGNOSIS — E6609 Other obesity due to excess calories: Secondary | ICD-10-CM

## 2024-06-16 MED ORDER — WEGOVY 0.25 MG/0.5ML ~~LOC~~ SOAJ: 0.2500 mg | SUBCUTANEOUS | 0 refills | Status: DC

## 2024-06-16 MED ORDER — VITAMIN D (ERGOCALCIFEROL) 1.25 MG (50000 UNIT) PO CAPS: 50000.0000 [IU] | ORAL_CAPSULE | ORAL | 0 refills | Status: DC

## 2024-06-16 NOTE — Progress Notes (Signed)
 WEIGHT SUMMARY AND BIOMETRICS  Weight Lost Since Last Visit: 0  Weight Gained Since Last Visit: 1lb   Vitals Temp: 98.3 F (36.8 C) BP: 129/82 Pulse Rate: 78 SpO2: 99 %   Anthropometric Measurements Height: 5' 4 (1.626 m) Weight: 227 lb (103 kg) BMI (Calculated): 38.95 Weight at Last Visit: 226lb Weight Lost Since Last Visit: 0 Weight Gained Since Last Visit: 1lb Starting Weight: 229lb Total Weight Loss (lbs): 2 lb (0.907 kg)   Body Composition  Body Fat %: 43.4 % Fat Mass (lbs): 98.6 lbs Muscle Mass (lbs): 122 lbs Total Body Water (lbs): 90.8 lbs Visceral Fat Rating : 10   Other Clinical Data Fasting: no Labs: no Today's Visit #: 5 Starting Date: 12/03/23    OBESITY Robin Robin [name] is here to discuss her progress with her obesity treatment plan along with follow-up of her obesity related diagnoses.    Nutrition Plan: the Category 2 plan - 60% adherence.  Current exercise: Goes to the gym   Interim Robin [name]:  She is up 1 lb since her last visit.  Eating all of the food on the plan., Protein intake is as prescribed, Is skipping meals, Not journaling consistently., and Water intake is adequate.   Pharmacotherapy: Robin Robin [name] is on Wegovy  0.25 mg SQ weekly Adverse side effects: None Hunger is moderately controlled.  Cravings are moderately controlled.  She is craving more carbohydrates.   Assessment/Plan:    Vitamin D  Deficiency Vitamin D  is not at goal of 50.  Most recent vitamin D  level was 27.4. She is on  prescription ergocalciferol  50,000 IU weekly. Her energy is stable. She is sleeping ok most of the time.  Lab Results  Component Value Date   VD25OH 27.4 (L) 12/03/2023    Plan: Refill prescription vitamin D  50,000 IU weekly.   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  Plan: She will begin Wegovy  at 0.25 mg weekly.  Demonstrated the pen and injection technique. Robin Robin [name] of thyroid  cancer, Robin [name] of pancreatitis, or current cholelithiasis. Robin Robin [name] of the most common side effects (nausea, constipation, diarrhea). She was given GLP-1 information sheet.  Patient Robin [name] to watch for possible symptoms, such as a lump or swelling in the neck, hoarseness, trouble swallowing, or shortness of breath. If you have any of these symptoms, tell your healthcare provide.   She is aware that she needs to use reliable birth control at all times while taking a GLP-1. Robin [name] that she is to use a back-up method for the first month and then again for 1 month if the dosage is increased.   She has been placed on a 500 calorie deficit diet.  She has been advised to exercise at least 150 minutes per week, both cardio and  resistance.      Generalized Obesity: Current BMI BMI (Calculated): 38.95   Pharmacotherapy Plan Start  Wegovy  0.25 mg SQ weekly  Robin Robin [name] is currently in the action stage of change. As such, her goal is to continue with weight loss efforts.  She has agreed to the Category 2 plan.  Exercise goals: For substantial health benefits, adults should do at least 150 minutes (2 hours and 30 minutes) a week of moderate-intensity, or 75 minutes (1 hour and 15 minutes) a week of vigorous-intensity aerobic physical activity, or an equivalent combination of moderate- and vigorous-intensity aerobic activity. Aerobic activity should be performed in episodes of at least 10 minutes, and preferably, it should be spread throughout the week. She started weight lifting.   Behavioral modification strategies: increasing lean protein intake, no meal skipping, increase water intake, better snacking choices, avoiding temptations, keep healthy foods in the home, and mindful eating.  Robin Robin [name] has agreed to follow-up with our  clinic in 4 weeks.    Objective:   VITALS: Per patient if applicable, see vitals. GENERAL: Alert and in no acute distress. CARDIOPULMONARY: No increased WOB. Speaking in clear sentences.  PSYCH: Pleasant and cooperative. Speech normal rate and rhythm. Affect is appropriate. Insight and judgement are appropriate. Attention is focused, linear, and appropriate.  NEURO: Oriented as arrived to appointment on time with no prompting.   Attestation Statements:   This was prepared with the assistance of Engineer, civil (consulting).  Occasional wrong-word or sound-a-like substitutions may have occurred due to the inherent limitations of voice recognition   Clayborne Daring, DO

## 2024-06-23 DIAGNOSIS — F439 Reaction to severe stress, unspecified: Secondary | ICD-10-CM | POA: Diagnosis not present

## 2024-06-23 DIAGNOSIS — L680 Hirsutism: Secondary | ICD-10-CM | POA: Diagnosis not present

## 2024-06-23 DIAGNOSIS — L219 Seborrheic dermatitis, unspecified: Secondary | ICD-10-CM | POA: Diagnosis not present

## 2024-07-16 ENCOUNTER — Encounter: Payer: Self-pay | Admitting: Bariatrics

## 2024-07-16 ENCOUNTER — Telehealth: Payer: Self-pay

## 2024-07-16 ENCOUNTER — Ambulatory Visit: Admitting: Bariatrics

## 2024-07-16 VITALS — BP 113/74 | HR 66 | Temp 97.8°F | Ht 64.0 in | Wt 226.0 lb

## 2024-07-16 DIAGNOSIS — R632 Polyphagia: Secondary | ICD-10-CM | POA: Diagnosis not present

## 2024-07-16 DIAGNOSIS — E669 Obesity, unspecified: Secondary | ICD-10-CM | POA: Diagnosis not present

## 2024-07-16 DIAGNOSIS — E66812 Obesity, class 2: Secondary | ICD-10-CM | POA: Diagnosis not present

## 2024-07-16 DIAGNOSIS — E559 Vitamin D deficiency, unspecified: Secondary | ICD-10-CM | POA: Diagnosis not present

## 2024-07-16 DIAGNOSIS — Z6838 Body mass index (BMI) 38.0-38.9, adult: Secondary | ICD-10-CM | POA: Diagnosis not present

## 2024-07-16 DIAGNOSIS — Z0000 Encounter for general adult medical examination without abnormal findings: Secondary | ICD-10-CM

## 2024-07-16 MED ORDER — VITAMIN D (ERGOCALCIFEROL) 1.25 MG (50000 UNIT) PO CAPS: 50000.0000 [IU] | ORAL_CAPSULE | ORAL | 0 refills | Status: DC

## 2024-07-16 NOTE — Progress Notes (Signed)
 WEIGHT SUMMARY AND BIOMETRICS  Weight Lost Since Last Visit: 1lb  Weight Gained Since Last Visit: 0   Vitals Temp: 97.8 F (36.6 C) BP: 113/74 Pulse Rate: 66 SpO2: 99 %   Anthropometric Measurements Height: 5' 4 (1.626 m) Weight: 226 lb (102.5 kg) BMI (Calculated): 38.77 Weight at Last Visit: 227lb Weight Lost Since Last Visit: 1lb Weight Gained Since Last Visit: 0 Starting Weight: 229lb Total Weight Loss (lbs): 3 lb (1.361 kg)   Body Composition  Body Fat %: 42.5 % Fat Mass (lbs): 96.2 lbs Muscle Mass (lbs): 123.6 lbs Total Body Water (lbs): 87.4 lbs Visceral Fat Rating : 10   Other Clinical Data Fasting: yes Labs: yes Today's Visit #: 6 Starting Date: 12/03/23    OBESITY Kateryna is here to discuss her progress with her obesity treatment plan along with follow-up of her obesity related diagnoses.    Nutrition Plan: the Category 2 plan - 80% adherence.  Current exercise: walking  Interim History:  She is down 1 lb since her last visit.  She is staying between 1,200 to 1,400 calories a day. She states that she is getting more protein.  Eating all of the food on the plan., Protein intake is as prescribed, Not journaling consistently., and Water intake is adequate.   Pharmacotherapy: Meilani was prescribed Wegovy , but the insurance did not pay.  Hunger is moderately controlled.  Cravings are moderately controlled.  Assessment/Plan:   Vitamin D  Deficiency Vitamin D  is not at goal of 50.  Most recent vitamin D  level was 27.4. She is on  prescription ergocalciferol  50,000 IU weekly. Lab Results  Component Value Date   VD25OH 27.4 (L) 12/03/2023    Plan: Refill prescription vitamin D  50,000 IU weekly.   Polyphagia Harris endorses excessive hunger.  Medication(s): None  Effects of medication (appetite) :  moderately controlled. Cravings are  moderately controlled.   Plan: Medication(s): none Will increase water, protein and fiber to help assuage hunger.  Handout for good sources of fiber.  Will minimize foods that have a high glucose index/load to minimize reactive hypoglycemia.  Will continue to journal.   Healthcare maintenance:   obesity  Plan: Will do labs ( CMP, Lipids, vit D, and insulin ).   Generalized Obesity: Current BMI BMI (Calculated): 38.77    Jyra is currently in the action stage of change. As such, her goal is to continue with weight loss efforts.  She has agreed to the Category 2 plan.  Exercise goals: All adults should avoid inactivity. Some physical activity is better than none, and adults who participate in any amount of physical activity gain some health benefits. She is learning to swim. She is walking at the school.   Behavioral modification strategies: increasing lean protein intake, decreasing simple carbohydrates , no meal skipping, meal planning , increase water intake, better snacking choices, planning for success, increasing  vegetables, increasing fiber rich foods, decrease snacking , avoiding temptations, keep healthy foods in the home, weigh protein portions, measure portion sizes, and mindful eating.  Anaiza has agreed to follow-up with our clinic in 4 weeks.   Objective:   VITALS: Per patient if applicable, see vitals. GENERAL: Alert and in no acute distress. CARDIOPULMONARY: No increased WOB. Speaking in clear sentences.  PSYCH: Pleasant and cooperative. Speech normal rate and rhythm. Affect is appropriate. Insight and judgement are appropriate. Attention is focused, linear, and appropriate.  NEURO: Oriented as arrived to appointment on time with no prompting.   Attestation Statements:   This was prepared with the assistance of Engineer, civil (consulting).  Occasional wrong-word or sound-a-like substitutions may have occurred due to the inherent limitations of voice recognition   Clayborne Daring, DO

## 2024-07-16 NOTE — Telephone Encounter (Signed)
 Started PA for [employer] via covermymeds

## 2024-07-17 LAB — LIPID PANEL WITH LDL/HDL RATIO
Cholesterol, Total: 142 mg/dL (ref 100–199)
HDL: 49 mg/dL (ref >39)
LDL Chol Calc (NIH): 84 mg/dL (ref 0–99)
LDL/HDL Ratio: 1.7 ratio (ref 0.0–3.2)
Triglycerides: 36 mg/dL (ref 0–149)
VLDL Cholesterol Cal: 9 mg/dL (ref 5–40)

## 2024-07-17 LAB — INSULIN, RANDOM: INSULIN: 13.4 u[IU]/mL (ref 2.6–24.9)

## 2024-07-17 LAB — COMPREHENSIVE METABOLIC PANEL WITH GFR
ALT: 16 IU/L (ref 0–32)
AST: 17 IU/L (ref 0–40)
Albumin: 4 g/dL (ref 3.9–4.9)
Alkaline Phosphatase: 75 IU/L (ref 44–121)
BUN/Creatinine Ratio: 13 (ref 9–23)
BUN: 11 mg/dL (ref 6–20)
Bilirubin Total: 0.3 mg/dL (ref 0.0–1.2)
CO2: 24 mmol/L (ref 20–29)
Calcium: 9.5 mg/dL (ref 8.7–10.2)
Chloride: 101 mmol/L (ref 96–106)
Creatinine, Ser: 0.84 mg/dL (ref 0.57–1.00)
Globulin, Total: 2.7 g/dL (ref 1.5–4.5)
Glucose: 85 mg/dL (ref 70–99)
Potassium: 4.5 mmol/L (ref 3.5–5.2)
Sodium: 138 mmol/L (ref 134–144)
Total Protein: 6.7 g/dL (ref 6.0–8.5)
eGFR: 95 mL/min/1.73 (ref >59)

## 2024-07-17 LAB — VITAMIN D 25 HYDROXY (VIT D DEFICIENCY, FRACTURES): Vit D, 25-Hydroxy: 24.6 ng/mL — ABNORMAL LOW (ref 30.0–100.0)

## 2024-07-21 ENCOUNTER — Encounter: Payer: Self-pay | Admitting: Bariatrics

## 2024-07-21 DIAGNOSIS — E559 Vitamin D deficiency, unspecified: Secondary | ICD-10-CM | POA: Insufficient documentation

## 2024-07-21 NOTE — Telephone Encounter (Signed)
 Wegovy  denied due to it not being a covered benefit per benefit booklet or plan documents.

## 2024-08-21 DIAGNOSIS — H5203 Hypermetropia, bilateral: Secondary | ICD-10-CM | POA: Diagnosis not present

## 2024-08-21 DIAGNOSIS — H52223 Regular astigmatism, bilateral: Secondary | ICD-10-CM | POA: Diagnosis not present

## 2024-08-24 ENCOUNTER — Ambulatory Visit: Admitting: Bariatrics

## 2024-08-27 ENCOUNTER — Encounter: Payer: Self-pay | Admitting: Bariatrics

## 2024-08-27 ENCOUNTER — Ambulatory Visit: Admitting: Bariatrics

## 2024-08-27 DIAGNOSIS — E669 Obesity, unspecified: Secondary | ICD-10-CM

## 2024-08-27 DIAGNOSIS — E66812 Obesity, class 2: Secondary | ICD-10-CM

## 2024-08-27 DIAGNOSIS — Z6839 Body mass index (BMI) 39.0-39.9, adult: Secondary | ICD-10-CM

## 2024-08-27 DIAGNOSIS — E559 Vitamin D deficiency, unspecified: Secondary | ICD-10-CM

## 2024-08-27 MED ORDER — CHOLECALCIFEROL 1.25 MG (50000 UT) PO TABS: ORAL_TABLET | ORAL | 0 refills | Status: AC

## 2024-08-27 NOTE — Progress Notes (Signed)
 WEIGHT SUMMARY AND BIOMETRICS  Weight Lost Since Last Visit: 0  Weight Gained Since Last Visit: 4lb   Vitals Temp: 98.6 F (37 C) BP: 119/80 Pulse Rate: 73 SpO2: 99 %   Anthropometric Measurements Height: 5' 4 (1.626 m) Weight: 230 lb (104.3 kg) BMI (Calculated): 39.46 Weight at Last Visit: 226lb Weight Lost Since Last Visit: 0 Weight Gained Since Last Visit: 4lb Starting Weight: 229lb Total Weight Loss (lbs): 0 lb (0 kg)   Body Composition  Body Fat %: 41.2 % Fat Mass (lbs): 94.8 lbs Muscle Mass (lbs): 128.4 lbs Total Body Water (lbs): 93.8 lbs Visceral Fat Rating : 10   Other Clinical Data Fasting: no Labs: no Today's Visit #: 7 Starting Date: 12/03/23    OBESITY Robin [name] is here to discuss her progress with her obesity treatment plan along with follow-up of her obesity related diagnoses.    Nutrition Plan: the Category 2 plan - 50% adherence.  Current exercise: Goes to the gym for exercise.  Interim History:  She is up 4 lbs since her last visit. She states that she stopped tracking her food. She wants to eat outside the plan. She is tried of eating the food.  Not eating all of the food on the plan., Is not skipping meals, and Water intake is adequate.  Hunger is moderately controlled.  Cravings are moderately controlled.  Assessment/Plan:   Vitamin D  Deficiency Vitamin D  is not at goal of 50.  Most recent vitamin D  level was 24.1. Patient states that she is taking vitamin D  2 as directed.  She is on  prescription ergocalciferol  50,000 IU weekly. Lab Results  Component Value Date   VD25OH 24.6 (L) 07/16/2024   VD25OH 27.4 (L) 12/03/2023    Plan: Change to vitamin D -3  prescription vitamin D  50,000 IU weekly.     Generalized Obesity: Current BMI BMI (Calculated): 39.46  Will resume journaling on a regular basis and tracking protein  and calories.   Pharmacotherapy Plan No anti-obesity medications  Skyla is currently in the action stage of change. As such, her goal is to continue with weight loss efforts.  She has agreed to the Category 2 plan.  Exercise goals: All adults should avoid inactivity. Some physical activity is better than none, and adults who participate in any amount of physical activity gain some health benefits. She will increase her exercise to 3 to 5 times a week.   Behavioral modification strategies: increasing lean protein intake, no meal skipping, meal planning , increase water intake, better snacking choices, planning for success, keep healthy foods in the home, measure portion sizes, and mindful eating.  Tyana has agreed to follow-up with our clinic in 4 weeks.   Objective:   VITALS: Per patient if applicable, see vitals. GENERAL: Alert and in no acute distress. CARDIOPULMONARY: No increased WOB. Speaking in clear sentences.  PSYCH:  Pleasant and cooperative. Speech normal rate and rhythm. Affect is appropriate. Insight and judgement are appropriate. Attention is focused, linear, and appropriate.  NEURO: Oriented as arrived to appointment on time with no prompting.   Attestation Statements:   This was prepared with the assistance of Engineer, civil (consulting).  Occasional wrong-word or sound-a-like substitutions may have occurred due to the inherent limitations of voice recognition   Clayborne Daring, DO

## 2024-09-08 DIAGNOSIS — F439 Reaction to severe stress, unspecified: Secondary | ICD-10-CM | POA: Diagnosis not present

## 2024-09-17 ENCOUNTER — Encounter: Payer: Self-pay | Admitting: Bariatrics

## 2024-09-17 ENCOUNTER — Ambulatory Visit: Admitting: Bariatrics

## 2024-09-17 VITALS — BP 132/88 | HR 72 | Temp 98.2°F | Ht 64.0 in | Wt 232.0 lb

## 2024-09-17 DIAGNOSIS — E669 Obesity, unspecified: Secondary | ICD-10-CM | POA: Diagnosis not present

## 2024-09-17 DIAGNOSIS — Z6839 Body mass index (BMI) 39.0-39.9, adult: Secondary | ICD-10-CM | POA: Diagnosis not present

## 2024-09-17 DIAGNOSIS — R632 Polyphagia: Secondary | ICD-10-CM

## 2024-09-17 DIAGNOSIS — E6609 Other obesity due to excess calories: Secondary | ICD-10-CM

## 2024-09-17 NOTE — Progress Notes (Signed)
 WEIGHT SUMMARY AND BIOMETRICS  Weight Lost Since Last Visit: 0  Weight Gained Since Last Visit: 2lb   Vitals Temp: 98.2 F (36.8 C) BP: 132/88 Pulse Rate: 72 SpO2: 99 %   Anthropometric Measurements Height: 5' 4 (1.626 m) Weight: 232 lb (105.2 kg) BMI (Calculated): 39.8 Weight at Last Visit: 230lb Weight Lost Since Last Visit: 0 Weight Gained Since Last Visit: 2lb Starting Weight: 229lb Total Weight Loss (lbs): 0 lb (0 kg)   Body Composition  Body Fat %: 43.5 % Fat Mass (lbs): 101 lbs Muscle Mass (lbs): 124.8 lbs Total Body Water (lbs): 90 lbs Visceral Fat Rating : 11   Other Clinical Data Fasting: yes Labs: no Today's Visit #: 8 Starting Date: 12/03/23    OBESITY Robin [name] is here to discuss her progress with her obesity treatment plan along with follow-up of her obesity related diagnoses.    Nutrition Plan: the Category 2 plan - 70% adherence.  Current exercise: walking  Interim History:  She is up 2 lbs since her last visit.  Eating all of the food on the plan., Protein intake is as prescribed, Is skipping meals, and Water intake is inadequate.   Pharmacotherapy: Robin [name] is not on any anti-obesity medications.  Hunger is moderately controlled.  Cravings are poorly controlled. She is craving carbohydrates. She is stressed at work. Assessment/Plan:   Robin [name] endorses excessive hunger.  Medication(s): none Effects of medication:  moderately controlled. Cravings are poorly controlled.   Plan: Medication(s): not on any anti-obesity medications. She does not want to try any anti-obesity medications for appetite or cravings at this time. Will increase water, protein and fiber to help assuage hunger.  Will minimize foods that have a high glucose index/load to minimize reactive hypoglycemia.  Discussed healthy snacks and multiple handouts  given (Low carbohydrate snacks, Snack Attack, and reviewed 100 calorie snacks.  Discussed distraction techniques.   Generalized Obesity: Current BMI BMI (Calculated): 39.8   Pharmacotherapy Plan She may want to try a medication for cravings in the future.   Robin [name] is currently in the action stage of change. As such, her goal is to continue with weight loss efforts.  She has agreed to the Category 2 plan.  Exercise goals: For substantial health benefits, adults should do at least 150 minutes (2 hours and 30 minutes) a week of moderate-intensity, or 75 minutes (1 hour and 15 minutes) a week of vigorous-intensity aerobic physical activity, or an equivalent combination of moderate- and vigorous-intensity aerobic activity. Aerobic activity should be performed in episodes of at least 10 minutes, and preferably, it should be spread throughout the week. She is going to the gym and walking.    Behavioral modification strategies: increasing lean protein intake, decreasing simple carbohydrates , no meal skipping, meal planning , increase water intake, better snacking choices, planning for success, increasing vegetables, increasing fiber rich foods, keep  healthy foods in the home, and weigh protein portions.  Robin [name] has agreed to follow-up with our clinic in 4 weeks.    Objective:   VITALS: Per patient if applicable, see vitals. GENERAL: Alert and in no acute distress. CARDIOPULMONARY: No increased WOB. Speaking in clear sentences.  PSYCH: Pleasant and cooperative. Speech normal rate and rhythm. Affect is appropriate. Insight and judgement are appropriate. Attention is focused, linear, and appropriate.  NEURO: Oriented as arrived to appointment on time with no prompting.   Attestation Statements:   This was prepared with the assistance of Engineer, Civil (consulting).  Occasional wrong-word or sound-a-like substitutions may have occurred due to the inherent limitations of voice recognition   Robin Daring,  DO

## 2024-09-23 DIAGNOSIS — N926 Irregular menstruation, unspecified: Secondary | ICD-10-CM | POA: Diagnosis not present

## 2024-09-23 DIAGNOSIS — Z01411 Encounter for gynecological examination (general) (routine) with abnormal findings: Secondary | ICD-10-CM | POA: Diagnosis not present

## 2024-09-23 DIAGNOSIS — D219 Benign neoplasm of connective and other soft tissue, unspecified: Secondary | ICD-10-CM | POA: Diagnosis not present

## 2024-10-06 DIAGNOSIS — F439 Reaction to severe stress, unspecified: Secondary | ICD-10-CM | POA: Diagnosis not present

## 2024-10-20 DIAGNOSIS — F439 Reaction to severe stress, unspecified: Secondary | ICD-10-CM | POA: Diagnosis not present

## 2024-10-22 ENCOUNTER — Ambulatory Visit: Admitting: Bariatrics

## 2024-10-26 DIAGNOSIS — Z1151 Encounter for screening for human papillomavirus (HPV): Secondary | ICD-10-CM | POA: Diagnosis not present

## 2024-10-26 DIAGNOSIS — N920 Excessive and frequent menstruation with regular cycle: Secondary | ICD-10-CM | POA: Diagnosis not present

## 2024-10-26 DIAGNOSIS — D219 Benign neoplasm of connective and other soft tissue, unspecified: Secondary | ICD-10-CM | POA: Diagnosis not present

## 2024-10-26 DIAGNOSIS — N945 Secondary dysmenorrhea: Secondary | ICD-10-CM | POA: Diagnosis not present

## 2024-10-26 DIAGNOSIS — Z124 Encounter for screening for malignant neoplasm of cervix: Secondary | ICD-10-CM | POA: Diagnosis not present

## 2024-11-30 ENCOUNTER — Ambulatory Visit: Admitting: Bariatrics

## 2025-01-25 ENCOUNTER — Ambulatory Visit: Admitting: Bariatrics
